# Patient Record
Sex: Male | Born: 1995 | Hispanic: Yes | Marital: Single | Smoking: Never smoker
Health system: Southern US, Community
[De-identification: ages and names within clinical notes are randomized; demographics above are authoritative.]

## PROBLEM LIST (undated history)

## (undated) DIAGNOSIS — R079 Chest pain, unspecified: Secondary | ICD-10-CM

## (undated) DIAGNOSIS — J309 Allergic rhinitis, unspecified: Secondary | ICD-10-CM

## (undated) DIAGNOSIS — I10 Essential (primary) hypertension: Secondary | ICD-10-CM

## (undated) DIAGNOSIS — I319 Disease of pericardium, unspecified: Secondary | ICD-10-CM

## (undated) DIAGNOSIS — M25531 Pain in right wrist: Secondary | ICD-10-CM

## (undated) DIAGNOSIS — S52321A Displaced transverse fracture of shaft of right radius, initial encounter for closed fracture: Secondary | ICD-10-CM

## (undated) DIAGNOSIS — J45990 Exercise induced bronchospasm: Secondary | ICD-10-CM

## (undated) HISTORY — DX: Essential (primary) hypertension: I10

## (undated) HISTORY — PX: FINGER SURGERY: SHX640

## (undated) HISTORY — DX: Pain in right wrist: M25.531

## (undated) HISTORY — DX: Allergic rhinitis, unspecified: J30.9

## (undated) HISTORY — DX: Exercise induced bronchospasm: J45.990

## (undated) HISTORY — DX: Disease of pericardium, unspecified: I31.9

## (undated) HISTORY — DX: Displaced transverse fracture of shaft of right radius, initial encounter for closed fracture: S52.321A

## (undated) HISTORY — DX: Chest pain, unspecified: R07.9

---
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
## 2017-02-15 ENCOUNTER — Emergency Department (HOSPITAL_COMMUNITY): Payer: BLUE CROSS/BLUE SHIELD

## 2017-02-15 ENCOUNTER — Ambulatory Visit: Payer: Self-pay | Admitting: Physician Assistant

## 2017-02-15 ENCOUNTER — Emergency Department (HOSPITAL_COMMUNITY)
Admission: EM | Admit: 2017-02-15 | Discharge: 2017-02-15 | Disposition: A | Discharge status: Home / Self Care | Payer: BLUE CROSS/BLUE SHIELD | Attending: Emergency Medicine | Admitting: Emergency Medicine

## 2017-02-15 ENCOUNTER — Encounter (HOSPITAL_COMMUNITY): Payer: Self-pay | Admitting: Emergency Medicine

## 2017-02-15 DIAGNOSIS — Z79899 Other long term (current) drug therapy: Secondary | ICD-10-CM | POA: Diagnosis not present

## 2017-02-15 DIAGNOSIS — I10 Essential (primary) hypertension: Secondary | ICD-10-CM | POA: Insufficient documentation

## 2017-02-15 DIAGNOSIS — I309 Acute pericarditis, unspecified: Secondary | ICD-10-CM | POA: Diagnosis not present

## 2017-02-15 DIAGNOSIS — R0789 Other chest pain: Secondary | ICD-10-CM | POA: Diagnosis present

## 2017-02-15 HISTORY — DX: Essential (primary) hypertension: I10

## 2017-02-15 LAB — I-STAT TROPONIN, ED: TROPONIN I, POC: 0 ng/mL (ref 0.00–0.08)

## 2017-02-15 LAB — CBC
HCT: 43.2 % (ref 39.0–52.0)
Hemoglobin: 15.3 g/dL (ref 13.0–17.0)
MCH: 30.5 pg (ref 26.0–34.0)
MCHC: 35.4 g/dL (ref 30.0–36.0)
MCV: 86.1 fL (ref 78.0–100.0)
PLATELETS: 236 10*3/uL (ref 150–400)
RBC: 5.02 MIL/uL (ref 4.22–5.81)
RDW: 12.9 % (ref 11.5–15.5)
WBC: 13.7 10*3/uL — AB (ref 4.0–10.5)

## 2017-02-15 LAB — BASIC METABOLIC PANEL
Anion gap: 11 (ref 5–15)
BUN: 13 mg/dL (ref 6–20)
CO2: 21 mmol/L — ABNORMAL LOW (ref 22–32)
CREATININE: 0.96 mg/dL (ref 0.61–1.24)
Calcium: 9.3 mg/dL (ref 8.9–10.3)
Chloride: 100 mmol/L — ABNORMAL LOW (ref 101–111)
GFR calc Af Amer: >60 mL/min (ref >60)
Glucose, Bld: 91 mg/dL (ref 65–99)
POTASSIUM: 4.1 mmol/L (ref 3.5–5.1)
SODIUM: 132 mmol/L — AB (ref 135–145)

## 2017-02-15 MED ORDER — KETOROLAC TROMETHAMINE 30 MG/ML IJ SOLN
30.0000 mg | Freq: Once | INTRAMUSCULAR | Status: AC
  Administered 2017-02-15: 30 mg via INTRAVENOUS
  Filled 2017-02-15: qty 1

## 2017-02-15 MED ORDER — IBUPROFEN 600 MG PO TABS: 600.0000 mg | ORAL_TABLET | Freq: Four times a day (QID) | ORAL | 0 refills | Status: DC | PRN

## 2017-02-15 NOTE — Discharge Instructions (Signed)
Medications: ibuprofen  Treatment: Take ibuprofen every 6 hours until told otherwise by cardiology.   Follow-up: Please see cardiologist this coming week for follow up of today's visit and further evaluation and treatment. Please return to the emergency department if you develop of any new or worsening symptoms.

## 2017-02-15 NOTE — ED Triage Notes (Signed)
Pt. Stated, I started having CP yesterday morning when taking a breathe.

## 2017-02-15 NOTE — ED Provider Notes (Signed)
[name] Provider Note   CSN: [id number] Arrival date & time: 02/15/17  1245     History   Chief Complaint Chief Complaint  Patient presents with  . Chest Pain    HPI Jeff LeydenLuis Horton is a 21 y.o. male with history of hypertension who presents with a 1 day history of pleuritic chest pain and shortness of breath.  Patient describes his pain as constant and is worse with a deep breath.  It is sharp and pressure combined.  He has had some episodes of shortness of breath while he is walking.  He reports he was sick with a cold about a week and a half ago.  He denies any recent long trips, surgeries, known cancer, new leg pain or swelling (except for right ankle injury from wrestling), smoking, or history of blood clots.  He denies any drug use including cocaine.  He occasionally drinks alcohol.  He is not taking any medications at home for his symptoms.  HPI  Past Medical History:  Diagnosis Date  . Hypertension     There are no active problems to display for this patient.   History reviewed. No pertinent surgical history.     Home Medications    Prior to Admission medications   Medication Sig Start Date End Date Taking? Authorizing Provider  lisinopril (PRINIVIL,ZESTRIL) 20 MG tablet Take 10 mg by mouth daily. 01/30/17  Yes [provider]  ibuprofen (ADVIL,MOTRIN) 600 MG tablet Take 1 tablet (600 mg total) by mouth every 6 (six) hours as needed. 02/15/17   Emi HolesLaw, Cathrine Krizan M, PA-C    Family History No family history on file.  Social History Social History  Substance Use Topics  . Smoking status: Never Smoker  . Smokeless tobacco: Never Used  . Alcohol use Yes     Allergies   Patient has no known allergies.   Review of Systems Review of Systems  Constitutional: Negative for chills and fever.  HENT: Negative for facial swelling and sore throat.   Respiratory: Positive for shortness of breath.   Cardiovascular:  Positive for chest pain and leg swelling (Only right ankle).  Gastrointestinal: Negative for abdominal pain, nausea and vomiting.  Genitourinary: Negative for dysuria.  Musculoskeletal: Negative for back pain.  Skin: Negative for rash and wound.  Neurological: Negative for headaches.  Psychiatric/Behavioral: The patient is not nervous/anxious.      Physical Exam Updated Vital Signs BP 137/68   Pulse (!) 50   Temp 98.4 F (36.9 C) (Oral)   Resp 18   Ht 5\' 10"  (1.778 m)   Wt 93 kg (205 lb)   SpO2 100%   BMI 29.41 kg/m   Physical Exam  Constitutional: He appears well-developed and well-nourished. No distress.  HENT:  Head: Normocephalic and atraumatic.  Mouth/Throat: Oropharynx is clear and moist. No oropharyngeal exudate.  Eyes: Pupils are equal, round, and reactive to light. Conjunctivae are normal. Right eye exhibits no discharge. Left eye exhibits no discharge. No scleral icterus.  Neck: Normal range of motion. Neck supple. No thyromegaly present.  Cardiovascular: Normal rate, regular rhythm and intact distal pulses.  Exam reveals friction rub. Exam reveals no gallop.   No murmur heard. Pulmonary/Chest: Effort normal and breath sounds normal. No stridor. No respiratory distress. He has no wheezes. He has no rales.  Abdominal: Soft. Bowel sounds are normal. He exhibits no distension. There is no tenderness. There is no rebound and no guarding.  Musculoskeletal: He exhibits no edema.  Lymphadenopathy:    He has no cervical adenopathy.  Neurological: He is alert. Coordination normal.  Skin: Skin is warm and dry. No rash noted. He is not diaphoretic. No pallor.  Psychiatric: He has a normal mood and affect.  Nursing note and vitals reviewed.    ED Treatments / Results  Labs (all labs ordered are listed, but only abnormal results are displayed) Labs Reviewed  BASIC METABOLIC PANEL - Abnormal; Notable for the following:       Result Value   Sodium 132 (*)    Chloride 100  (*)    CO2 21 (*)    All other components within normal limits  CBC - Abnormal; Notable for the following:    WBC 13.7 (*)    All other components within normal limits  I-STAT TROPONIN, ED    EKG  EKG Interpretation  Date/Time:  Saturday February 15 2017 12:51:37 EDT Ventricular Rate:  79 PR Interval:  156 QRS Duration: 102 QT Interval:  372 QTC Calculation: 426 R Axis:   98 Text Interpretation:  Normal sinus rhythm with sinus arrhythmia Rightward axis Acute pericarditis Abnormal ECG No previous ECGs available Confirmed by Frederick Peers [phone]) on 02/15/2017 3:10:07 PM       Radiology Dg Chest 2 View  Result Date: 02/15/2017 CLINICAL DATA:  Midsternal chest pain. EXAM: CHEST  2 VIEW COMPARISON:  None. FINDINGS: The heart size and mediastinal contours are within normal limits. Both lungs are clear. The visualized skeletal structures are unremarkable. IMPRESSION: No active cardiopulmonary disease. Electronically Signed   By: Gerome Sam III M.D   On: 02/15/2017 14:26    Procedures Procedures (including critical care time)  Medications Ordered in ED Medications  ketorolac (TORADOL) 30 MG/ML injection 30 mg (30 mg Intravenous Given 02/15/17 1329)     Initial Impression / Assessment and Plan / ED Course  I have reviewed the triage vital signs and the nursing notes.  Pertinent labs & imaging results that were available during my care of the patient were reviewed by me and considered in my medical decision making (see chart for details).  Clinical Course as of Feb 16 1608  Sat Feb 15, 2017  1538 On reevaluation after Toradol, patient reports his symptoms are almost completely better and that he only feels pain with a super deep breath.  [AL]    Clinical Course User Index [AL] Emi Holes, PA-C    Patient with suspected pericarditis.  Chest x-ray is negative.  EKG shows diffuse ST elevation concerning for pericarditis.  Suspected friction rub found on  auscultation.  CBC shows WBC 13.7, otherwise labs unremarkable.  Patient having almost complete resolution of symptoms after Toradol.  Patient is well-appearing with stable vitals.  Will have patient follow-up with cardiology for further evaluation and treatment.  Return precautions discussed.  Will discharge home with regular dosing of ibuprofen until told otherwise by cardiology.  Patient understands and agrees with plan.  Patient discharged in satisfactory condition. I discussed patient case with Dr. Clarene Duke who guided the patient's management and agrees with plan.   Final Clinical Impressions(s) / ED Diagnoses   Final diagnoses:  Acute pericarditis, unspecified type    New Prescriptions New Prescriptions   IBUPROFEN (ADVIL,MOTRIN) 600 MG TABLET    Take 1 tablet (600 mg total) by mouth every 6 (six) hours as needed.     Emi Holes, PA-C 02/15/17 1609    Little, Ambrose Finland, MD 02/16/17 [phone]

## 2017-02-17 ENCOUNTER — Encounter: Payer: Self-pay | Admitting: *Deleted

## 2017-02-21 NOTE — Progress Notes (Signed)
Cardiology Office Note:    Date:  02/24/2017   ID:  Jeff Horton, DOB [birth year], MRN [id number]  PCP:  Jeff Horton, Jeff W, MD  Cardiologist:  Jeff HerrlichBrian Savannah Morford, MD   Referring MD: Jeff Horton, Jeff W, MD  ASSESSMENT:    1. Acute idiopathic pericarditis    PLAN:    In order of problems listed above:  1. Clinically resolved, check sedimentation rate before return to full activities and stopping ibuprofen.  With competitive sports chest pain abnormal EKG he will undergo echocardiogram.  I suspect it will be normal.  He will continue his antihypertensive treatment.  Next appointment as needed if echocardiogram and sedimentation rate are normal   Medication Adjustments/Labs and Tests Ordered: Current medicines are reviewed at length with the patient today.  Concerns regarding medicines are outlined above.  Orders Placed This Encounter  Procedures  . Sedimentation rate  . EKG 12-Lead  . ECHOCARDIOGRAM COMPLETE   No orders of the defined types were placed in this encounter.    Chief Complaint  Patient presents with  . Follow-up    Visit to ED with pericarditis    History of Present Illness:    Jeff LeydenLuis Horton is a 21 y.o. male who is being seen today for the evaluation of pericarditis [facility]  Treated with NSAI at the request of Jeff Horton, Jeff W, MD. He had pleuritic chest pain, friction rub and EKG changes of pericarditis.  He relates that his symptoms resolved within 24 hours taking ibuprofen.  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  His abnormal EKG and chest pain he will undergo echocardiogram.  He has a history of hypertension taking ACE inhibitor relates a normal echocardiogram 2 years ago.  If his sedimentation rate is normal he can stop ibuprofen and return to activities without restriction.  He had a URI preceding his pleuritic chest pain.  He has had no palpitation syncope exertional chest pain or dyspnea.  He has no family history of  cardiomyopathy or sudden cardiac death.  He has had no tick bite symptoms.  Past Medical History:  Diagnosis Date  . Allergic rhinitis 01/15/2013  . 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 Chest pain   . Closed displaced transverse fracture of shaft of right radius 06/28/2014  . HTN (hypertension) 01/05/2014   Overview:  BP mildly elevated.  NO LVH or coarctation on echo Monitoring BP and cardiology follow up in 02/2014  . Hypertension   . Pericarditis   . Right wrist pain 06/28/2014    History reviewed. No pertinent surgical history.  Current Medications: Current Meds  Medication Sig  . ibuprofen (ADVIL,MOTRIN) 600 MG tablet Take 1 tablet (600 mg total) by mouth every 6 (six) hours as needed.  Marland Kitchen. lisinopril (PRINIVIL,ZESTRIL) 20 MG tablet Take 20 mg by mouth daily.      Allergies:   Patient has no known allergies.   Social History   Social History  . Marital status: Single    Spouse name: N/A  . Number of children: N/A  . Years of education: N/A   Social History Main Topics  . Smoking status: Never Smoker  . Smokeless tobacco: Never Used  . Alcohol use Yes  . Drug use: No  . Sexual activity: Not Asked   Other Topics Concern  . None   Social History Narrative  . None     Family History: The patient's father had [facility], strong family history of hypertension ROS:  Review of Systems  Constitution: Negative.  HENT: Negative.   Eyes: Negative.   Cardiovascular: Positive for chest pain (resolved). Negative for claudication, cyanosis, dyspnea on exertion, irregular heartbeat, leg swelling, near-syncope, orthopnea, palpitations, paroxysmal nocturnal dyspnea and syncope.  Respiratory: Positive for shortness of breath (resolved). Negative for cough, hemoptysis, sleep disturbances due to breathing, snoring, sputum production and wheezing.   Endocrine: Negative.   Hematologic/Lymphatic: Negative.   Skin: Negative.   Musculoskeletal: Negative.     Gastrointestinal: Negative.   Genitourinary: Negative.   Neurological: Negative.   Psychiatric/Behavioral: Negative.   Allergic/Immunologic: Negative.    Please see the history of present illness.     EKGs/Labs/Other Studies Reviewed:    The following studies were reviewed today:  CHEST  2 VIEW COMPARISON:  None. FINDINGS: The heart size and mediastinal contours are within normal limits. Both lungs are clear. The visualized skeletal structures are unremarkable. IMPRESSION: No active cardiopulmonary disease. EKG today, sinus rhythm normal ST-T changes have resolved EKG:  EKG 02/15/17 with diffuse ST elevation and PR depression, acute pericarditis pattern  Recent Labs: WBC 13,700 02/15/2017: BUN 13; Creatinine, Ser 0.96; Hemoglobin 15.3; Platelets 236; Potassium 4.1; Sodium 132  Recent Lipid Panel No results found for: CHOL, TRIG, HDL, CHOLHDL, VLDL, LDLCALC, LDLDIRECT  Physical Exam:    VS:  BP 118/72 (BP Location: Left Arm, Patient Position: Sitting, Cuff Size: Normal)   Pulse 64   Ht 5\' 10"  (1.778 m)   Wt 206 lb (93.4 kg)   SpO2 98%   BMI 29.56 kg/m     Wt Readings from Last 3 Encounters:  02/24/17 206 lb (93.4 kg) (95 %, Z= 1.62)*  02/15/17 205 lb (93 kg) (95 %, Z= 1.60)*   * Growth percentiles are based on CDC 2-20 Years data.     GEN:  Well nourished, well developed in no acute distress HEENT: Normal NECK: No JVD; No carotid bruits LYMPHATICS: No lymphadenopathy CARDIAC: No murmur or rub RRR, no murmurs, rubs, gallops RESPIRATORY:  Clear to auscultation without rales, wheezing or rhonchi  ABDOMEN: Soft, non-tender, non-distended MUSCULOSKELETAL:  No edema; No deformity  SKIN: Warm and dry NEUROLOGIC:  Alert and oriented x 3 PSYCHIATRIC:  Normal affect     Signed, Jeff Herrlich, MD  02/24/2017 12:51 PM    [facility]

## 2017-02-24 ENCOUNTER — Ambulatory Visit (INDEPENDENT_AMBULATORY_CARE_PROVIDER_SITE_OTHER): Payer: BLUE CROSS/BLUE SHIELD | Admitting: Cardiology

## 2017-02-24 ENCOUNTER — Encounter: Payer: Self-pay | Admitting: Cardiology

## 2017-02-24 VITALS — BP 118/72 | HR 64 | Ht 70.0 in | Wt 206.0 lb

## 2017-02-24 DIAGNOSIS — I300 Acute nonspecific idiopathic pericarditis: Secondary | ICD-10-CM | POA: Diagnosis not present

## 2017-02-24 NOTE — Patient Instructions (Signed)
Medication Instructions:  Your physician recommends that you continue on your current medications as directed. Please refer to the Current Medication list given to you today.  Labwork: Your physician recommends that you return for lab work in: today. Sed rate  Testing/Procedures: You had an EKG today.  Your physician has requested that you have an echocardiogram. Echocardiography is a painless test that uses sound waves to create images of your heart. It provides your doctor with information about the size and shape of your heart and how well your heart's chambers and valves are working. This procedure takes approximately one hour. There are no restrictions for this procedure.  Follow-Up: Your physician recommends that you schedule a follow-up appointment as needed if symptoms worsen or fail to improve.  Any Other Special Instructions Will Be Listed Below (If Applicable).     If you need a refill on your cardiac medications before your next appointment, please call your pharmacy.

## 2017-02-25 ENCOUNTER — Other Ambulatory Visit: Payer: Self-pay

## 2017-02-25 ENCOUNTER — Ambulatory Visit (HOSPITAL_COMMUNITY): Payer: BLUE CROSS/BLUE SHIELD | Attending: Cardiology

## 2017-02-25 DIAGNOSIS — I300 Acute nonspecific idiopathic pericarditis: Secondary | ICD-10-CM

## 2017-02-25 DIAGNOSIS — I420 Dilated cardiomyopathy: Secondary | ICD-10-CM | POA: Insufficient documentation

## 2017-02-25 LAB — SEDIMENTATION RATE: Sed Rate: 6 mm/hr (ref 0–15)

## 2017-02-26 ENCOUNTER — Other Ambulatory Visit: Payer: Self-pay | Admitting: Cardiology

## 2017-02-26 DIAGNOSIS — I517 Cardiomegaly: Secondary | ICD-10-CM

## 2017-03-19 ENCOUNTER — Other Ambulatory Visit (HOSPITAL_BASED_OUTPATIENT_CLINIC_OR_DEPARTMENT_OTHER): Payer: PRIVATE HEALTH INSURANCE

## 2017-04-16 DIAGNOSIS — I300 Acute nonspecific idiopathic pericarditis: Secondary | ICD-10-CM | POA: Insufficient documentation

## 2017-05-06 ENCOUNTER — Encounter: Payer: Self-pay | Admitting: Cardiology

## 2017-05-06 ENCOUNTER — Telehealth: Payer: Self-pay | Admitting: Cardiology

## 2017-05-06 NOTE — Telephone Encounter (Signed)
Tried calling the patient to give him MRI appointment date and time, but, his VM was not set up yet so could not leave a message.

## 2017-05-16 ENCOUNTER — Ambulatory Visit (HOSPITAL_COMMUNITY)
Admission: RE | Admit: 2017-05-16 | Discharge: 2017-05-16 | Disposition: A | Discharge status: Home / Self Care | Payer: BLUE CROSS/BLUE SHIELD | Source: Ambulatory Visit | Attending: Cardiology | Admitting: Cardiology

## 2017-05-16 DIAGNOSIS — R9439 Abnormal result of other cardiovascular function study: Secondary | ICD-10-CM | POA: Diagnosis not present

## 2017-05-16 DIAGNOSIS — I517 Cardiomegaly: Secondary | ICD-10-CM | POA: Diagnosis present

## 2017-05-16 LAB — CREATININE, SERUM: Creatinine, Ser: 1.2 mg/dL (ref 0.61–1.24)

## 2017-05-16 MED ORDER — GADOBENATE DIMEGLUMINE 529 MG/ML IV SOLN
30.0000 mL | Freq: Once | INTRAVENOUS | Status: AC | PRN
  Administered 2017-05-16: 30 mL via INTRAVENOUS

## 2017-07-28 ENCOUNTER — Encounter: Payer: Self-pay | Admitting: Physician Assistant

## 2017-08-19 NOTE — Progress Notes (Signed)
Cardiology Office Note:    Date:  08/20/2017   ID:  Jeff Horton, DOB March 27, 1996, MRN [id number]  PCP:  Herbert Deaner, MD  Cardiologist:  Shirlee More, MD    Referring MD: Herbert Deaner, MD    ASSESSMENT:    1. Chest pain in adult   2. Pericarditis, unspecified chronicity, unspecified type    PLAN:    In order of problems listed above:  1. He has typical findings of pericarditis with symptoms rub EKG changes.  He will be treated with nonsteroidal anti-inflammatory drug colchicine check inflammatory markers and recheck echocardiogram.  I will reassess back in my office in 1 week.   Next appointment: 1 week   Medication Adjustments/Labs and Tests Ordered: Current medicines are reviewed at length with the patient today.  Concerns regarding medicines are outlined above.  Orders Placed This Encounter  Procedures  . Sed Rate (ESR)  . C-reactive protein  . EKG 12-Lead  . ECHOCARDIOGRAM COMPLETE   Meds ordered this encounter  Medications  . ibuprofen (ADVIL,MOTRIN) 800 MG tablet    Sig: Take 1 tablet (800 mg total) by mouth 3 (three) times daily.    Dispense:  90 tablet    Refill:  0  . colchicine 0.6 MG tablet    Sig: Take 1 tablet (0.6 mg total) by mouth 2 (two) times daily.    Dispense:  60 tablet    Refill:  0    Chief Complaint  Patient presents with  . Follow-up  . Chest Pain    previous pericarditis    History of Present Illness:    Jeff Horton is a 22 y.o. male with a hx of pericarditis in October 2018 and an abnormal TTE last seen 02/24/17.  IMPRESSION: CMR with LGE 1.  Trivial pericardial effusion. 2.  Mildly dilated LV with EF 61%, normal wall motion. 3.  Mildly dilated RV with EF 48% (EF within normal limits for RV). 4. No myocardial LGE, so no evidence for prior MI, infiltrative disease, or myocarditis. Given young age of patient, possible athlete's heart.  Echo TTE 02/25/18 - Left ventricle: The cavity size was normal. Wall thickness  was   increased in a pattern of mild LVH. Systolic function was normal.   The estimated ejection fraction was in the range of 60% to 65%.   Wall motion was normal; there were no regional wall motion   abnormalities. Left ventricular diastolic function parameters   were normal. - Right ventricle: The cavity size was mildly dilated. Wall   thickness was normal. - Right atrium: The atrium was mildly dilated. Central venous   pressure (est): 8 mm Hg. - Pericardium, extracardiac: There was no pericardial effusion.  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  The last 1 to 2 weeks he has had intermittent left-sided pleuritic chest pain more supine and took ibuprofen on few occasions which gave him transient relief over-the-counter 200 mg.  No fever or chills but has had a cough with a minimal amount of sputum he is not short of breath he has had no edema.  He has no other symptoms to suggest connective tissue disease such as rash or change in skin.  There is no family history of familial Mediterranean fever.  His EKG is classic for pericarditis today he has a pericardial rub and he had previous pericarditis in October 2018.  We will check inflammatory markers today for  repeat echocardiogram start him on ibuprofen and colchicine and reassess in 1 week.  If he does not respond to treatment he will need a rheumatologic evaluation. Past Medical History:  Diagnosis Date  . Allergic rhinitis 01/15/2013  . 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 Chest pain   . Closed displaced transverse fracture of shaft of right radius 06/28/2014  . HTN (hypertension) 01/05/2014   Overview:  BP mildly elevated.  NO LVH or coarctation on echo Monitoring BP and cardiology follow up in 02/2014  . Hypertension   . Pericarditis   . Right wrist pain 06/28/2014    Past Surgical History:  Procedure Laterality Date  . FINGER SURGERY      Current  Medications: Current Meds  Medication Sig  . ibuprofen (ADVIL,MOTRIN) 800 MG tablet Take 1 tablet (800 mg total) by mouth 3 (three) times daily.  Marland Kitchen lisinopril (PRINIVIL,ZESTRIL) 20 MG tablet Take 20 mg by mouth daily.   . [DISCONTINUED] ibuprofen (ADVIL,MOTRIN) 600 MG tablet Take 1 tablet (600 mg total) by mouth every 6 (six) hours as needed.     Allergies:   Patient has no known allergies.   Social History   Socioeconomic History  . Marital status: Single    Spouse name: Not on file  . Number of children: Not on file  . Years of education: Not on file  . Highest education level: Not on file  Occupational History  . Not on file  Social Needs  . Financial resource strain: Not on file  . Food insecurity:    Worry: Not on file    Inability: Not on file  . Transportation needs:    Medical: Not on file    Non-medical: Not on file  Tobacco Use  . Smoking status: Never Smoker  . Smokeless tobacco: Never Used  Substance and Sexual Activity  . Alcohol use: Yes  . Drug use: No  . Sexual activity: Not on file  Lifestyle  . Physical activity:    Days per week: Not on file    Minutes per session: Not on file  . Stress: Not on file  Relationships  . Social connections:    Talks on phone: Not on file    Gets together: Not on file    Attends religious service: Not on file    Active member of club or organization: Not on file    Attends meetings of clubs or organizations: Not on file    Relationship status: Not on file  Other Topics Concern  . Not on file  Social History Narrative  . Not on file     Family History: The patient's family history includes Asthma in his mother; Coronary artery disease in his father; Diabetes in his maternal grandfather and maternal grandmother; Hypertension in his father, maternal grandfather, maternal grandmother, paternal grandfather, and paternal grandmother. ROS:   Please see the history of present illness.    All other systems reviewed and  are negative.  EKGs/Labs/Other Studies Reviewed:    The following studies were reviewed today:  EKG:  EKG ordered today.  The ekg ordered today demonstrates sinus rhythm typical pericarditis with diffuse ST elevation and subtle PR segment depression markedly different than his baseline EKG for my office visit  Recent Labs: 02/15/2017: BUN 13; Hemoglobin 15.3; Platelets 236; Potassium 4.1; Sodium 132 05/16/2017: Creatinine, Ser 1.20  Recent Lipid Panel No results found for: CHOL, TRIG, HDL, CHOLHDL, VLDL, LDLCALC, LDLDIRECT  Physical Exam:    VS:  BP 106/74 (BP  Location: Right Arm, Patient Position: Sitting, Cuff Size: Large)   Pulse 66   Ht 5' 10"  (1.778 m)   Wt 206 lb 12.8 oz (93.8 kg)   SpO2 99%   BMI 29.67 kg/m     Wt Readings from Last 3 Encounters:  08/20/17 206 lb 12.8 oz (93.8 kg) (95 %, Z= 1.60)*  02/24/17 206 lb (93.4 kg) (95 %, Z= 1.62)*  02/15/17 205 lb (93 kg) (95 %, Z= 1.60)*   * Growth percentiles are based on CDC (Boys, 2-20 Years) data.     GEN:  Well nourished, well developed in no acute distress HEENT: Normal NECK: No JVD; No carotid bruits LYMPHATICS: No lymphadenopathy CARDIAC: faint pleuropericardial rub on inspiration RRR, no murmurs, rubs, gallops RESPIRATORY:  Clear to auscultation without rales, wheezing or rhonchi  ABDOMEN: Soft, non-tender, non-distended MUSCULOSKELETAL:  No edema; No deformity  SKIN: Warm and dry NEUROLOGIC:  Alert and oriented x 3 PSYCHIATRIC:  Normal affect    Signed, Shirlee More, MD  08/20/2017 9:01 AM    [place]

## 2017-08-20 ENCOUNTER — Ambulatory Visit (INDEPENDENT_AMBULATORY_CARE_PROVIDER_SITE_OTHER): Payer: BLUE CROSS/BLUE SHIELD | Admitting: Cardiology

## 2017-08-20 ENCOUNTER — Encounter: Payer: Self-pay | Admitting: Cardiology

## 2017-08-20 ENCOUNTER — Telehealth: Payer: Self-pay | Admitting: *Deleted

## 2017-08-20 VITALS — BP 106/74 | HR 66 | Ht 70.0 in | Wt 206.8 lb

## 2017-08-20 DIAGNOSIS — R079 Chest pain, unspecified: Secondary | ICD-10-CM

## 2017-08-20 DIAGNOSIS — I319 Disease of pericardium, unspecified: Secondary | ICD-10-CM | POA: Diagnosis not present

## 2017-08-20 DIAGNOSIS — I300 Acute nonspecific idiopathic pericarditis: Secondary | ICD-10-CM | POA: Insufficient documentation

## 2017-08-20 MED ORDER — IBUPROFEN 800 MG PO TABS: 800.0000 mg | ORAL_TABLET | Freq: Three times a day (TID) | ORAL | 0 refills | Status: DC

## 2017-08-20 MED ORDER — COLCHICINE 0.6 MG PO TABS: 0.6000 mg | ORAL_TABLET | Freq: Two times a day (BID) | ORAL | 0 refills | Status: DC

## 2017-08-20 NOTE — Patient Instructions (Signed)
Medication Instructions:  Your physician has recommended you make the following change in your medication:  START colchicine 0.6 mg twice daily START ibuprofen 800 mg three times daily with food  Labwork: Your physician recommends that you have the following labs drawn: CRP, sedimentation rate  Testing/Procedures: Your physician has requested that you have an echocardiogram. Echocardiography is a painless test that uses sound waves to create images of your heart. It provides your doctor with information about the size and shape of your heart and how well your heart's chambers and valves are working. This procedure takes approximately one hour. There are no restrictions for this procedure.  Follow-Up: Your physician recommends that you schedule a follow-up appointment in: 1 week.  Any Other Special Instructions Will Be Listed Below (If Applicable).     If you need a refill on your cardiac medications before your next appointment, please call your pharmacy.

## 2017-08-20 NOTE — Telephone Encounter (Signed)
Pt phoned to verify how long should be on Colchicine. Looked in chart and let pt know he was given 30 days worth. Pt stated his understanding.

## 2017-08-21 ENCOUNTER — Ambulatory Visit (HOSPITAL_BASED_OUTPATIENT_CLINIC_OR_DEPARTMENT_OTHER)
Admission: RE | Admit: 2017-08-21 | Discharge: 2017-08-21 | Disposition: A | Discharge status: Home / Self Care | Payer: BLUE CROSS/BLUE SHIELD | Source: Ambulatory Visit | Attending: Cardiology | Admitting: Cardiology

## 2017-08-21 ENCOUNTER — Telehealth: Payer: Self-pay

## 2017-08-21 DIAGNOSIS — I10 Essential (primary) hypertension: Secondary | ICD-10-CM | POA: Insufficient documentation

## 2017-08-21 DIAGNOSIS — I878 Other specified disorders of veins: Secondary | ICD-10-CM | POA: Insufficient documentation

## 2017-08-21 DIAGNOSIS — I313 Pericardial effusion (noninflammatory): Secondary | ICD-10-CM | POA: Insufficient documentation

## 2017-08-21 DIAGNOSIS — R079 Chest pain, unspecified: Secondary | ICD-10-CM | POA: Diagnosis present

## 2017-08-21 DIAGNOSIS — I319 Disease of pericardium, unspecified: Secondary | ICD-10-CM

## 2017-08-21 LAB — C-REACTIVE PROTEIN: CRP: 18.6 mg/L — ABNORMAL HIGH (ref 0.0–4.9)

## 2017-08-21 LAB — SEDIMENTATION RATE: Sed Rate: 2 mm/hr (ref 0–15)

## 2017-08-21 NOTE — Telephone Encounter (Signed)
-----   Message from Baldo DaubBrian J Munley, MD sent at 08/21/2017 12:24 PM EDT ----- Echo with small effusion, continue meds

## 2017-08-21 NOTE — Telephone Encounter (Signed)
Attempted to contact patient, no answer, no voicemail. Will continue efforts. 

## 2017-08-25 NOTE — Telephone Encounter (Signed)
Attempted to contact patient, no answer, no voicemail. Mailing results to patient's home address.

## 2017-08-29 ENCOUNTER — Encounter: Payer: Self-pay | Admitting: Cardiology

## 2017-08-29 ENCOUNTER — Ambulatory Visit (INDEPENDENT_AMBULATORY_CARE_PROVIDER_SITE_OTHER): Payer: BLUE CROSS/BLUE SHIELD | Admitting: Cardiology

## 2017-08-29 VITALS — BP 140/70 | HR 65 | Ht 70.0 in | Wt 209.0 lb

## 2017-08-29 DIAGNOSIS — I300 Acute nonspecific idiopathic pericarditis: Secondary | ICD-10-CM | POA: Diagnosis not present

## 2017-08-29 MED ORDER — COLCHICINE 0.6 MG PO TABS
0.6000 mg | ORAL_TABLET | Freq: Two times a day (BID) | ORAL | 0 refills | Status: DC
Start: 2017-08-29 — End: 2017-11-12

## 2017-08-29 NOTE — Patient Instructions (Signed)
Medication Instructions:  Your physician has recommended you make the following change in your medication: REDUCE colchicine to once daily  Labwork: Your physician recommends that you have the following labs drawn: CBC, CRP  Testing/Procedures: You had an EKG today.  Follow-Up: Your physician recommends that you schedule a follow-up appointment in: 3 weeks.  Any Other Special Instructions Will Be Listed Below (If Applicable).     If you need a refill on your cardiac medications before your next appointment, please call your pharmacy.

## 2017-08-29 NOTE — Progress Notes (Signed)
Cardiology Office Note:    Date:  08/29/2017   ID:  Dhillon Comunale, DOB 1995/07/30, MRN [id number]  PCP:  Maura Crandall, MD  Cardiologist:  Norman Herrlich, MD    Referring MD: Maura Crandall, MD    ASSESSMENT:    1. Recurrent idiopathic pericarditis    PLAN:    In order of problems listed above:  1. He is improved his EKG is normalized chest pain quickly resolved and will continue his nonsteroidal anti-inflammatory drug for 1 month and until his CRP is normal.  He will reduce colchicine to once daily after 30 days and we will plan to continue for 3 to 6 months with his recurrent pericarditis.  I asked him to continue to abstain or athletic training until his next visit   Next appointment: 3 weeks   Medication Adjustments/Labs and Tests Ordered: Current medicines are reviewed at length with the patient today.  Concerns regarding medicines are outlined above.  No orders of the defined types were placed in this encounter.  No orders of the defined types were placed in this encounter.   Chief Complaint  Patient presents with  . Follow-up    History of Present Illness:    Jeff Horton is a 22 y.o. male with a hx of recurrent pericarditis  last seen 08/20/17.  Echo 08/21/17: Study Conclusions  - Left ventricle: The cavity size was normal. Wall thickness was   normal. Systolic function was normal. The estimated ejection   fraction was in the range of 60% to 65%. Wall motion was normal;   there were no regional wall motion abnormalities. Left   ventricular diastolic function parameters were normal. - Inferior vena cava: The vessel was mildly dilated. The   respirophasic diameter changes were in the normal range (>= 50%). - Pericardium, extracardiac: A small, free-flowing pericardial   effusion was identified circumferential to the heart. The fluid   had no internal echoes.  ASSESSMENT:    1. Chest pain in adult   2. Pericarditis, unspecified chronicity,  unspecified type    PLAN:    In order of problems listed above:  1.     He has typical findings of pericarditis with symptoms rub EKG changes.  He will be treated with nonsteroidal anti-inflammatory drug colchicine check inflammatory markers and recheck echocardiogram.  I will reassess back in my office in 1 week.  Compliance with diet, lifestyle and medications: Yes He is improved does not feel ill no pleuritic chest pain fever or chills.  He has had palpitation extra beat at night not persistent severe or frequent.  With extra beats he has momentary breathlessness.  His EKG today confirms sinus rhythm Past Medical History:  Diagnosis Date  . Allergic rhinitis 01/15/2013  . 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 Chest pain   . Closed displaced transverse fracture of shaft of right radius 06/28/2014  . HTN (hypertension) 01/05/2014   Overview:  BP mildly elevated.  NO LVH or coarctation on echo Monitoring BP and cardiology follow up in 02/2014  . Hypertension   . Pericarditis   . Right wrist pain 06/28/2014    Past Surgical History:  Procedure Laterality Date  . FINGER SURGERY      Current Medications: Current Meds  Medication Sig  . colchicine 0.6 MG tablet Take 1 tablet (0.6 mg total) by mouth 2 (two) times daily.  Marland Kitchen ibuprofen (ADVIL,MOTRIN) 800 MG tablet Take 1 tablet (800 mg total) by mouth 3 (three) times daily.  Marland Kitchen  lisinopril (PRINIVIL,ZESTRIL) 20 MG tablet Take 20 mg by mouth daily.      Allergies:   Patient has no known allergies.   Social History   Socioeconomic History  . Marital status: Single    Spouse name: Not on file  . Number of children: Not on file  . Years of education: Not on file  . Highest education level: Not on file  Occupational History  . Not on file  Social Needs  . Financial resource strain: Not on file  . Food insecurity:    Worry: Not on file    Inability: Not on file  . Transportation needs:    Medical: Not on file    Non-medical: Not on  file  Tobacco Use  . Smoking status: Never Smoker  . Smokeless tobacco: Never Used  Substance and Sexual Activity  . Alcohol use: Yes  . Drug use: No  . Sexual activity: Not on file  Lifestyle  . Physical activity:    Days per week: Not on file    Minutes per session: Not on file  . Stress: Not on file  Relationships  . Social connections:    Talks on phone: Not on file    Gets together: Not on file    Attends religious service: Not on file    Active member of club or organization: Not on file    Attends meetings of clubs or organizations: Not on file    Relationship status: Not on file  Other Topics Concern  . Not on file  Social History Narrative  . Not on file     Family History: The patient's family history includes Asthma in his mother; Coronary artery disease in his father; Diabetes in his maternal grandfather and maternal grandmother; Hypertension in his father, maternal grandfather, maternal grandmother, paternal grandfather, and paternal grandmother. ROS:   Please see the history of present illness.    All other systems reviewed and are negative.  EKGs/Labs/Other Studies Reviewed:    The following studies were reviewed today:  EKG:  EKG ordered today.  The ekg ordered today demonstrates sinus rhythm and ST segments are normal  Recent Labs:  Sed Rate 0 - 15 mm/hr 2  6    CRP 0.0 - 4.9 mg/L 18.6High      02/15/2017: BUN 13; Hemoglobin 15.3; Platelets 236; Potassium 4.1; Sodium 132 05/16/2017: Creatinine, Ser 1.20  Recent Lipid Panel No results found for: CHOL, TRIG, HDL, CHOLHDL, VLDL, LDLCALC, LDLDIRECT  Physical Exam:    VS:  Wt 209 lb (94.8 kg)   BMI 29.99 kg/m     Wt Readings from Last 3 Encounters:  08/29/17 209 lb (94.8 kg) (95 %, Z= 1.65)*  08/20/17 206 lb 12.8 oz (93.8 kg) (95 %, Z= 1.60)*  02/24/17 206 lb (93.4 kg) (95 %, Z= 1.62)*   * Growth percentiles are based on CDC (Boys, 2-20 Years) data.     GEN:  Well nourished, well developed in  no acute distress HEENT: Normal NECK: No JVD; No carotid bruits LYMPHATICS: No lymphadenopathy CARDIAC: no rub is present today RRR, no murmurs, rubs, gallops RESPIRATORY:  Clear to auscultation without rales, wheezing or rhonchi  ABDOMEN: Soft, non-tender, non-distended MUSCULOSKELETAL:  No edema; No deformity  SKIN: Warm and dry NEUROLOGIC:  Alert and oriented x 3 PSYCHIATRIC:  Normal affect    Signed, Norman Herrlich, MD  08/29/2017 11:26 AM    [facility]

## 2017-08-30 LAB — CBC
Hematocrit: 41.6 % (ref 37.5–51.0)
Hemoglobin: 13.8 g/dL (ref 13.0–17.7)
MCH: 30.1 pg (ref 26.6–33.0)
MCHC: 33.2 g/dL (ref 31.5–35.7)
MCV: 91 fL (ref 79–97)
Platelets: 257 10*3/uL (ref 150–379)
RBC: 4.59 x10E6/uL (ref 4.14–5.80)
RDW: 14.4 % (ref 12.3–15.4)
WBC: 9.8 10*3/uL (ref 3.4–10.8)

## 2017-08-30 LAB — C-REACTIVE PROTEIN: CRP: 48 mg/L — ABNORMAL HIGH (ref 0.0–4.9)

## 2017-09-23 ENCOUNTER — Telehealth: Payer: Self-pay

## 2017-09-23 MED ORDER — IBUPROFEN 800 MG PO TABS
800.0000 mg | ORAL_TABLET | Freq: Three times a day (TID) | ORAL | 1 refills | Status: DC
Start: 2017-09-23 — End: 2017-11-12

## 2017-09-23 NOTE — Telephone Encounter (Signed)
Rx for Ibuprofen  one tablet three times a day #270 sent to pharmacy as requested.

## 2017-09-24 NOTE — Progress Notes (Signed)
Cardiology Office Note:    Date:  09/26/2017   ID:  Jeff Horton, DOB May 04, 1995, MRN [id number]  PCP:  Maura Crandall, MD  Cardiologist:  Norman Herrlich, MD    Referring MD: Maura Crandall, MD    ASSESSMENT:    1. Recurrent idiopathic pericarditis   2. Chest pain in adult    PLAN:    In order of problems listed above:  1. He has ongoing symptoms despite colchicine and NSAID.  We will recheck a CRP and if it remains elevated initiate steroids.  I have asked asked for labs to be done to screen for lupus he has no other criteria and will continue colchicine and NSAID.  At this time I do not think he requires a Holter or event monitor.   Next appointment: 6 weeks   Medication Adjustments/Labs and Tests Ordered: Current medicines are reviewed at length with the patient today.  Concerns regarding medicines are outlined above.  Orders Placed This Encounter  Procedures  . Antinuclear Antib (ANA)  . Anti-DNA antibody, double-stranded  . C3 complement  . C4 complement  . CBC with Differential  . Comprehensive Metabolic Panel (CMET)  . C-reactive protein  . EKG 12-Lead   No orders of the defined types were placed in this encounter.   Chief Complaint  Patient presents with  . Follow-up    for pericarditis    History of Present Illness:    Jeff Horton is a 22 y.o. male with a hx of recurrent pericarditis last seen 08/29/17. Compliance with diet, lifestyle and medications: Yes He tolerates and continues on prescription strength NSAID and colchicine.  He is continuing to have intermittent not severe pleuritic left-sided chest pain.  Also some mild palpitation at nighttime not severe sustained and he does not feel systemically ill no fever or chills and is having no joint skin problems and no other findings to suggest connective tissue disease. Past Medical History:  Diagnosis Date  . Allergic rhinitis 01/15/2013  . 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 Chest pain   .  Closed displaced transverse fracture of shaft of right radius 06/28/2014  . HTN (hypertension) 01/05/2014   Overview:  BP mildly elevated.  NO LVH or coarctation on echo Monitoring BP and cardiology follow up in 02/2014  . Hypertension   . Pericarditis   . Right wrist pain 06/28/2014    Past Surgical History:  Procedure Laterality Date  . FINGER SURGERY      Current Medications: Current Meds  Medication Sig  . colchicine 0.6 MG tablet Take 1 tablet (0.6 mg total) by mouth 2 (two) times daily. Reduce to once a day on 09/19/17  . ibuprofen (ADVIL,MOTRIN) 800 MG tablet Take 1 tablet (800 mg total) by mouth 3 (three) times daily.  Marland Kitchen lisinopril (PRINIVIL,ZESTRIL) 20 MG tablet Take 20 mg by mouth daily.      Allergies:   Patient has no known allergies.   Social History   Socioeconomic History  . Marital status: Single    Spouse name: Not on file  . Number of children: Not on file  . Years of education: Not on file  . Highest education level: Not on file  Occupational History  . Not on file  Social Needs  . Financial resource strain: Not on file  . Food insecurity:    Worry: Not on file    Inability: Not on file  . Transportation needs:    Medical: Not on file    Non-medical:  Not on file  Tobacco Use  . Smoking status: Never Smoker  . Smokeless tobacco: Never Used  Substance and Sexual Activity  . Alcohol use: Yes  . Drug use: No  . Sexual activity: Not on file  Lifestyle  . Physical activity:    Days per week: Not on file    Minutes per session: Not on file  . Stress: Not on file  Relationships  . Social connections:    Talks on phone: Not on file    Gets together: Not on file    Attends religious service: Not on file    Active member of club or organization: Not on file    Attends meetings of clubs or organizations: Not on file    Relationship status: Not on file  Other Topics Concern  . Not on file  Social History Narrative  . Not on file     Family  History: The patient's  family history includes Asthma in his mother; Coronary artery disease in his father; Diabetes in his maternal grandfather and maternal grandmother; Hypertension in his father, maternal grandfather, maternal grandmother, paternal grandfather, and paternal grandmother. ROS:   Please see the history of present illness.    All other systems reviewed and are negative.  EKGs/Labs/Other Studies Reviewed:    The following studies were reviewed today:  EKG:  EKG ordered today.  The ekg ordered today demonstrates sinus rhythm normal no indication of acute pericarditis single PVC present  Recent Labs:  CRP 0.0 - 4.9 mg/L 48.0High   18.6High      02/15/2017: BUN 13; Potassium 4.1; Sodium 132 05/16/2017: Creatinine, Ser 1.20 08/29/2017: Hemoglobin 13.8; Platelets 257  Recent Lipid Panel No results found for: CHOL, TRIG, HDL, CHOLHDL, VLDL, LDLCALC, LDLDIRECT  Physical Exam:    VS:  BP 130/90 (BP Location: Right Arm, Patient Position: Sitting, Cuff Size: Normal)   Pulse (!) 59   Ht  (1.778 m)   Wt 201 lb (91.2 kg)   SpO2 99%   BMI 28.84 kg/m     Wt Readings from Last 3 Encounters:  09/26/17 201 lb (91.2 kg) (93 %, Z= 1.46)*  08/29/17 209 lb (94.8 kg) (95 %, Z= 1.65)*  08/20/17 206 lb 12.8 oz (93.8 kg) (95 %, Z= 1.60)*   * Growth percentiles are based on CDC (Boys, 2-20 Years) data.     GEN:  Well nourished, well developed in no acute distress HEENT: Normal NECK: No JVD; No carotid bruits LYMPHATICS: No lymphadenopathy CARDIAC: No rubRRR, no murmurs, rubs, gallops RESPIRATORY:  Clear to auscultation without rales, wheezing or rhonchi  ABDOMEN: Soft, non-tender, non-distended MUSCULOSKELETAL:  No edema; No deformity  SKIN: Warm and dry NEUROLOGIC:  Alert and oriented x 3 PSYCHIATRIC:  Normal affect    Signed, Norman Herrlich, MD  09/26/2017 12:33 PM    [facility]

## 2017-09-26 ENCOUNTER — Ambulatory Visit (INDEPENDENT_AMBULATORY_CARE_PROVIDER_SITE_OTHER): Payer: BLUE CROSS/BLUE SHIELD | Admitting: Cardiology

## 2017-09-26 ENCOUNTER — Encounter: Payer: Self-pay | Admitting: Cardiology

## 2017-09-26 VITALS — BP 130/90 | HR 59 | Ht 70.0 in | Wt 201.0 lb

## 2017-09-26 DIAGNOSIS — I300 Acute nonspecific idiopathic pericarditis: Secondary | ICD-10-CM

## 2017-09-26 DIAGNOSIS — R079 Chest pain, unspecified: Secondary | ICD-10-CM | POA: Diagnosis not present

## 2017-09-26 NOTE — Patient Instructions (Signed)
Medication Instructions:  Your physician recommends that you continue on your current medications as directed. Please refer to the Current Medication list given to you today.   Labwork: Your physician recommends that you have lab work today        Testing/Procedures: You had an EKG today  Follow-Up: Your physician recommends that you schedule a follow-up appointment in: 6 weeks.   Any Other Special Instructions Will Be Listed Below (If Applicable).     If you need a refill on your cardiac medications before your next appointment, please call your pharmacy.

## 2017-09-27 LAB — CBC WITH DIFFERENTIAL/PLATELET
BASOS ABS: 0 10*3/uL (ref 0.0–0.2)
Basos: 0 %
EOS (ABSOLUTE): 0.2 10*3/uL (ref 0.0–0.4)
Eos: 3 %
HEMATOCRIT: 41.6 % (ref 37.5–51.0)
Hemoglobin: 14.3 g/dL (ref 13.0–17.7)
Immature Grans (Abs): 0 10*3/uL (ref 0.0–0.1)
Immature Granulocytes: 0 %
LYMPHS ABS: 2.2 10*3/uL (ref 0.7–3.1)
Lymphs: 29 %
MCH: 29.6 pg (ref 26.6–33.0)
MCHC: 34.4 g/dL (ref 31.5–35.7)
MCV: 86 fL (ref 79–97)
MONOS ABS: 0.6 10*3/uL (ref 0.1–0.9)
Monocytes: 8 %
Neutrophils Absolute: 4.6 10*3/uL (ref 1.4–7.0)
Neutrophils: 60 %
Platelets: 206 10*3/uL (ref 150–450)
RBC: 4.83 x10E6/uL (ref 4.14–5.80)
RDW: 14.2 % (ref 12.3–15.4)
WBC: 7.6 10*3/uL (ref 3.4–10.8)

## 2017-09-27 LAB — C4 COMPLEMENT: COMPLEMENT C4, SERUM: 24 mg/dL (ref 14–44)

## 2017-09-27 LAB — C-REACTIVE PROTEIN: CRP: 2.8 mg/L (ref 0.0–4.9)

## 2017-09-27 LAB — C3 COMPLEMENT: COMPLEMENT C3, SERUM: 106 mg/dL (ref 82–167)

## 2017-09-27 LAB — COMPREHENSIVE METABOLIC PANEL
ALK PHOS: 63 IU/L (ref 39–117)
ALT: 26 IU/L (ref 0–44)
AST: 21 IU/L (ref 0–40)
Albumin/Globulin Ratio: 2.2 (ref 1.2–2.2)
Albumin: 4.7 g/dL (ref 3.5–5.5)
BILIRUBIN TOTAL: 0.3 mg/dL (ref 0.0–1.2)
BUN/Creatinine Ratio: 22 — ABNORMAL HIGH (ref 9–20)
BUN: 20 mg/dL (ref 6–20)
CHLORIDE: 101 mmol/L (ref 96–106)
CO2: 24 mmol/L (ref 20–29)
CREATININE: 0.92 mg/dL (ref 0.76–1.27)
Calcium: 9.5 mg/dL (ref 8.7–10.2)
GFR calc Af Amer: 139 mL/min/{1.73_m2} (ref >59)
GFR calc non Af Amer: 120 mL/min/{1.73_m2} (ref >59)
GLOBULIN, TOTAL: 2.1 g/dL (ref 1.5–4.5)
GLUCOSE: 86 mg/dL (ref 65–99)
Potassium: 4.8 mmol/L (ref 3.5–5.2)
SODIUM: 141 mmol/L (ref 134–144)
Total Protein: 6.8 g/dL (ref 6.0–8.5)

## 2017-09-27 LAB — ANTI-DNA ANTIBODY, DOUBLE-STRANDED: dsDNA Ab: 1 IU/mL (ref 0–9)

## 2017-09-27 LAB — ANA: Anti Nuclear Antibody(ANA): NEGATIVE

## 2017-09-29 ENCOUNTER — Telehealth: Payer: Self-pay

## 2017-09-29 DIAGNOSIS — I300 Acute nonspecific idiopathic pericarditis: Secondary | ICD-10-CM

## 2017-09-29 DIAGNOSIS — R079 Chest pain, unspecified: Secondary | ICD-10-CM

## 2017-09-29 NOTE — Telephone Encounter (Signed)
Patient notified of normal lab results per Dr Dulce SellarMunley, and that steroids will not be started at this time.   Patient advised to recheck CRP in 2 weeks. Patient states that [facility] will [facility] his home to have labs rechecked.  Order for CRP entered.  Patient verbalized understanding.

## 2017-09-29 NOTE — Telephone Encounter (Signed)
-----   Message from Baldo DaubBrian J Munley, MD sent at 09/29/2017  7:39 AM EDT ----- CRP and labs for rheumatologist disorders are all normal. I am not going to start steroids. Can he repeat a CRP in 2 weeks?

## 2017-11-11 NOTE — Progress Notes (Signed)
Cardiology Office Note:    Date:  11/12/2017   ID:  Jeff Horton, DOB March 16, 1996, MRN [id number]  PCP:  Maura Crandall, MD  Cardiologist:  Norman Herrlich, MD    Referring MD: Maura Crandall, MD    ASSESSMENT:    1. Recurrent idiopathic pericarditis   2. Essential hypertension   3. PVCs (premature ventricular contractions)   4. Palpitation   5. PVC (premature ventricular contraction)    PLAN:    In order of problems listed above:  1. Stable he is off treatment we will check an hs-CRP of elevated resume otherwise we will hold on drug therapy 2. Stable continue ACE inhibitor 3. Symptomatic await Holter place him on a low-dose of the selective beta-blocker to alleviate symptoms   Next appointment: 3 months   Medication Adjustments/Labs and Tests Ordered: Current medicines are reviewed at length with the patient today.  Concerns regarding medicines are outlined above.  Orders Placed This Encounter  Procedures  . CRP High sensitivity   Meds ordered this encounter  Medications  . acebutolol (SECTRAL) 400 MG capsule    Sig: Take 1 capsule (400 mg total) by mouth 2 (two) times daily.    Dispense:  30 capsule    Refill:  3    Chief Complaint  Patient presents with  . Palpitations    History of Present Illness:    Jeff Horton is a 22 y.o. male with a hx of recurrent idiopathic pericarditis last seen 10/06/2017.  His laboratory studies show no indication of a rheumatologic disease like lupus.  He has been maintained on colchicine and nonsteroidal anti-inflammatory drugs since his relapse. He was seen by Dr Farrel Conners at [facility] for a second opinion.  & Units 54mo ago (09/26/17) 74mo ago (08/29/17) 74mo ago (08/20/17)   CRP 0.0 - 4.9 mg/L 2.8  48.0High   18.6High     He has been off a NSAID and colchicine for the last month. He wore a Holter last weel results are pending. Compliance with diet, lifestyle and medications: Yes  He had been off of his nonsteroidal colchicine for a  few weeks before he is seen in rotation the physician was aware he has had no recurrent pleurisy.  We discussed restarting treatment and will order to do his check a high-sensitivity CRP of his elevated go back and treated if not remain off.  Couple times a day as brief momentary palpitation was told he had PVCs by the other cardiologist or monitor and relates that the results will come to me.  We had a discussion about treatment with the beta-blocker this make some apprehensive very cardiac aware so put him on a low-dose of a selective beta-blocker with his history of asthma to alleviate symptoms.  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  I told him if he does not hear from me in the next week about his Holter monitor contact my office. Past Medical History:  Diagnosis Date  . Allergic rhinitis 01/15/2013  . 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 Chest pain   . Closed displaced transverse fracture of shaft of right radius 06/28/2014  . HTN (hypertension) 01/05/2014   Overview:  BP mildly elevated.  NO LVH or coarctation on echo Monitoring BP and cardiology follow up in 02/2014  . Hypertension   . Pericarditis   . Right wrist pain 06/28/2014  Past Surgical History:  Procedure Laterality Date  . FINGER SURGERY      Current Medications: Current Meds  Medication Sig  . lisinopril (PRINIVIL,ZESTRIL) 20 MG tablet Take 20 mg by mouth daily.      Allergies:   Patient has no known allergies.   Social History   Socioeconomic History  . Marital status: Single    Spouse name: Not on file  . Number of children: Not on file  . Years of education: Not on file  . Highest education level: Not on file  Occupational History  . Not on file  Social Needs  . Financial resource strain: Not on file  . Food insecurity:    Worry: Not on file    Inability: Not on file  . Transportation needs:     Medical: Not on file    Non-medical: Not on file  Tobacco Use  . Smoking status: Never Smoker  . Smokeless tobacco: Never Used  Substance and Sexual Activity  . Alcohol use: Yes  . Drug use: No  . Sexual activity: Not on file  Lifestyle  . Physical activity:    Days per week: Not on file    Minutes per session: Not on file  . Stress: Not on file  Relationships  . Social connections:    Talks on phone: Not on file    Gets together: Not on file    Attends religious service: Not on file    Active member of club or organization: Not on file    Attends meetings of clubs or organizations: Not on file    Relationship status: Not on file  Other Topics Concern  . Not on file  Social History Narrative  . Not on file     Family History: The patient's family history includes Asthma in his mother; Coronary artery disease in his father; Diabetes in his maternal grandfather and maternal grandmother; Hypertension in his father, maternal grandfather, maternal grandmother, paternal grandfather, and paternal grandmother. ROS:   Please see the history of present illness.    All other systems reviewed and are negative.  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  Recent Labs:   CBC 09/30/2017 normal viral serologies were normal  09/26/2017: ALT 26; BUN 20; Creatinine, Ser 0.92; Hemoglobin 14.3; Platelets 206; Potassium 4.8; Sodium 141  Recent Lipid Panel No results found for: CHOL, TRIG, HDL, CHOLHDL, VLDL, LDLCALC, LDLDIRECT  Physical Exam:    VS:  BP 124/70 (BP Location: Right Arm, Patient Position: Sitting, Cuff Size: Normal)   Pulse 69   Ht 5\' 10"  (1.778 m)   Wt 202 lb 6.4 oz (91.8 kg)   SpO2 99%   BMI 29.04 kg/m     Wt Readings from Last 3 Encounters:  11/12/17 202 lb 6.4 oz (91.8 kg) (93  %, Z= 1.48)*  09/26/17 201 lb (91.2 kg) (93 %, Z= 1.46)*  08/29/17 209 lb (94.8 kg) (95 %, Z= 1.65)*   * Growth percentiles are based on CDC (Boys, 2-20 Years) data.     GEN:  Well nourished, well developed in no acute distress HEENT: Normal NECK: No JVD; No carotid bruits LYMPHATICS: No lymphadenopathy CARDIAC: No pericardial rub RRR, no murmurs, rubs, gallops RESPIRATORY:  Clear to auscultation without rales, wheezing or rhonchi  ABDOMEN: Soft, non-tender, non-distended MUSCULOSKELETAL:  No edema;  No deformity  SKIN: Warm and dry NEUROLOGIC:  Alert and oriented x 3 PSYCHIATRIC:  Normal affect    Signed, Norman HerrlichBrian Munley, MD  11/12/2017 11:57 AM    [facility]

## 2017-11-12 ENCOUNTER — Ambulatory Visit: Payer: BLUE CROSS/BLUE SHIELD | Admitting: Cardiology

## 2017-11-12 ENCOUNTER — Encounter: Payer: Self-pay | Admitting: Cardiology

## 2017-11-12 VITALS — BP 124/70 | HR 69 | Ht 70.0 in | Wt 202.4 lb

## 2017-11-12 DIAGNOSIS — I300 Acute nonspecific idiopathic pericarditis: Secondary | ICD-10-CM | POA: Diagnosis not present

## 2017-11-12 DIAGNOSIS — R002 Palpitations: Secondary | ICD-10-CM | POA: Diagnosis not present

## 2017-11-12 DIAGNOSIS — I10 Essential (primary) hypertension: Secondary | ICD-10-CM | POA: Diagnosis not present

## 2017-11-12 DIAGNOSIS — I493 Ventricular premature depolarization: Secondary | ICD-10-CM | POA: Diagnosis not present

## 2017-11-12 LAB — HIGH SENSITIVITY CRP: CRP, High Sensitivity: 0.72 mg/L (ref 0.00–3.00)

## 2017-11-12 MED ORDER — ACEBUTOLOL HCL 400 MG PO CAPS: 400.0000 mg | ORAL_CAPSULE | Freq: Two times a day (BID) | ORAL | 3 refills | Status: DC

## 2017-11-12 NOTE — Patient Instructions (Signed)
Medication Instructions:  Your physician has recommended you make the following change in your medication:   START: acebutalol 400mg  one capsule daily  Labwork: Your physician recommends that you have labs drawn today.  Testing/Procedures: NONE  Follow-Up: Your physician wants you to follow-up in: 3 months.  You will receive a reminder letter in the mail two months in advance. If you don't receive a letter, please call our office to schedule the follow-up appointment.   Any Other Special Instructions Will Be Listed Below (If Applicable).     If you need a refill on your cardiac medications before your next appointment, please call your pharmacy.

## 2017-11-12 NOTE — Addendum Note (Signed)
Addended by: Lamona CurlOUTH, NICOLE H on: 11/12/2017 01:36 PM   Modules accepted: Orders

## 2017-11-14 ENCOUNTER — Telehealth: Payer: Self-pay

## 2017-11-14 DIAGNOSIS — I300 Acute nonspecific idiopathic pericarditis: Secondary | ICD-10-CM

## 2017-11-14 DIAGNOSIS — I10 Essential (primary) hypertension: Secondary | ICD-10-CM

## 2017-11-14 DIAGNOSIS — I319 Disease of pericardium, unspecified: Secondary | ICD-10-CM

## 2017-11-14 NOTE — Telephone Encounter (Signed)
Patient advised that Dr Dulce SellarMunley has reviewed Holter Monitor results from Cardiology in [place].  Patient aware that  he is having frequent PVC's and should stay on acebutolol.  Patient agrees to plan and verbalized understanding.

## 2017-11-14 NOTE — Telephone Encounter (Signed)
-----   Message from Baldo DaubBrian J Munley, MD sent at 11/14/2017  2:50 PM EDT ----- He has frequent PVC's, stay on acebutolol

## 2017-11-14 NOTE — Telephone Encounter (Signed)
-----   Message from Baldo DaubBrian J Munley, MD sent at 11/12/2017  8:29 PM EDT ----- Normal or stable result  Good result, [facility]

## 2017-11-14 NOTE — Telephone Encounter (Signed)
Patient advised of lab results per Dr Dulce SellarMunley, and to recheck labs in 1 month at [facility].   Patient aware and agrees to plan.

## 2017-11-17 ENCOUNTER — Ambulatory Visit: Payer: BLUE CROSS/BLUE SHIELD | Admitting: Cardiology

## 2017-12-17 ENCOUNTER — Other Ambulatory Visit: Payer: Self-pay | Admitting: Cardiology

## 2017-12-18 LAB — HIGH SENSITIVITY CRP: CRP, High Sensitivity: 3.39 mg/L — ABNORMAL HIGH (ref 0.00–3.00)

## 2017-12-19 ENCOUNTER — Telehealth: Payer: Self-pay

## 2017-12-19 DIAGNOSIS — R079 Chest pain, unspecified: Secondary | ICD-10-CM

## 2017-12-19 DIAGNOSIS — I300 Acute nonspecific idiopathic pericarditis: Secondary | ICD-10-CM

## 2017-12-19 NOTE — Telephone Encounter (Signed)
-----   Message from Baldo DaubBrian J Munley, MD sent at 12/18/2017 10:12 AM EDT ----- His CRP remains elevated  I would resume his NSAID and Colchicine as I feel that his risk of recurrence is significant  Will he take them?

## 2017-12-19 NOTE — Telephone Encounter (Signed)
Attempted to reach patient regarding lab results, but mail box has not been set up.  Will continue efforts.

## 2017-12-22 MED ORDER — COLCHICINE 0.6 MG PO TABS: 0.6000 mg | ORAL_TABLET | Freq: Two times a day (BID) | ORAL | 1 refills | Status: AC

## 2017-12-22 MED ORDER — IBUPROFEN 800 MG PO TABS: 800.0000 mg | ORAL_TABLET | Freq: Three times a day (TID) | ORAL | 1 refills | Status: AC

## 2017-12-22 NOTE — Telephone Encounter (Signed)
Patient notified of elevated CRP per Dr Dulce SellarMunley.   Patient instructed to restart colchine 0.6mg  one tablet twice daily and ibuprofen 800mg  one tablet three times daily.  Rx sent to pharmacy.  Patient will recheck CBC and CRP in one month. Patient agrees to plan.

## 2018-01-21 LAB — C-REACTIVE PROTEIN: CRP: <1 mg/L (ref 0–10)

## 2018-02-12 ENCOUNTER — Ambulatory Visit (INDEPENDENT_AMBULATORY_CARE_PROVIDER_SITE_OTHER): Payer: BLUE CROSS/BLUE SHIELD | Admitting: Cardiology

## 2018-02-12 ENCOUNTER — Encounter: Payer: Self-pay | Admitting: Cardiology

## 2018-02-12 VITALS — BP 120/78 | HR 66 | Ht 70.0 in | Wt 201.1 lb

## 2018-02-12 DIAGNOSIS — I300 Acute nonspecific idiopathic pericarditis: Secondary | ICD-10-CM | POA: Diagnosis not present

## 2018-02-12 DIAGNOSIS — I493 Ventricular premature depolarization: Secondary | ICD-10-CM

## 2018-02-12 NOTE — Progress Notes (Signed)
Cardiology Office Note:    Date:  02/12/2018   ID:  Jeff Horton, DOB Dec 01, 1995, MRN [id number]  PCP:  Maura Crandall, MD  Cardiologist:  Norman Herrlich, MD    Referring MD: Maura Crandall, MD    ASSESSMENT:    1. Recurrent idiopathic pericarditis   2. PVC (premature ventricular contraction)    PLAN:    In order of problems listed above:  1. He continues to be symptomatic he was having pleuropericardial pain yesterday his physical exam shows no rub will check an EKG today and recheck his inflammatory markers if he is on objective evidence of persistent pericarditis I will place him on steroids along with his colchicine and nonsteroidal anti-inflammatory drug 2. Improved no longer takes beta-blocker   Next appointment: PRN he will see Dr Farrel Conners in [place]   Medication Adjustments/Labs and Tests Ordered: Current medicines are reviewed at length with the patient today.  Concerns regarding medicines are outlined above.  No orders of the defined types were placed in this encounter.  No orders of the defined types were placed in this encounter.   No chief complaint on file.   History of Present Illness:    Jeff Horton is a 22 y.o. male with a hx of recurrent pericarditis last seen in July 2019 Compliance with diet, lifestyle and medications: Yes  Yesterday he was having palpitation but also having pleuropericardial chest pain is resolved today is the first episode since he went back on colchicine.  In view of his recurrent pericarditis and ongoing symptoms a look for objective evidence including C-reactive protein and sedimentation rate check a white count on colchicine check in office EKG and make a decision whether not we placed him on corticosteroids along with his ibuprofen and colchicine.  He is already had a cardiac MRI performed if he failed treatment with these 3 modalities I would recheck it looking for evidence of pericardial inflammation in the region case reports  of biologic agents similar to what she is been rheumatoid arthritis to control disease with failure of recurrent pericarditis.  His PVCs and palpitation is markedly improved no longer takes beta-blocker Past Medical History:  Diagnosis Date  . Allergic rhinitis 01/15/2013  . 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 Chest pain   . Closed displaced transverse fracture of shaft of right radius 06/28/2014  . HTN (hypertension) 01/05/2014   Overview:  BP mildly elevated.  NO LVH or coarctation on echo Monitoring BP and cardiology follow up in 02/2014  . Hypertension   . Pericarditis   . Right wrist pain 06/28/2014    Past Surgical History:  Procedure Laterality Date  . FINGER SURGERY      Current Medications: Current Meds  Medication Sig  . colchicine 0.6 MG tablet Take 1 tablet (0.6 mg total) by mouth 2 (two) times daily. (Patient taking differently: Take 0.6 mg by mouth daily. )  . ibuprofen (ADVIL,MOTRIN) 800 MG tablet Take 1 tablet (800 mg total) by mouth 3 (three) times daily.     Allergies:   Patient has no known allergies.   Social History   Socioeconomic History  . Marital status: Single    Spouse name: Not on file  . Number of children: Not on file  . Years of education: Not on file  . Highest education level: Not on file  Occupational History  . Not on file  Social Needs  . Financial resource strain: Not on file  . Food insecurity:    Worry:  Not on file    Inability: Not on file  . Transportation needs:    Medical: Not on file    Non-medical: Not on file  Tobacco Use  . Smoking status: Never Smoker  . Smokeless tobacco: Never Used  Substance and Sexual Activity  . Alcohol use: Yes  . Drug use: No  . Sexual activity: Not on file  Lifestyle  . Physical activity:    Days per week: Not on file    Minutes per session: Not on file  . Stress: Not on file  Relationships  . Social connections:    Talks on phone: Not on file    Gets together: Not on file    Attends  religious service: Not on file    Active member of club or organization: Not on file    Attends meetings of clubs or organizations: Not on file    Relationship status: Not on file  Other Topics Concern  . Not on file  Social History Narrative  . Not on file     Family History: The patient's family history includes Asthma in his mother; Coronary artery disease in his father; Diabetes in his maternal grandfather and maternal grandmother; Hypertension in his father, maternal grandfather, maternal grandmother, paternal grandfather, and paternal grandmother. ROS:   Please see the history of present illness.    All other systems reviewed and are negative.  EKGs/Labs/Other Studies Reviewed:    The following studies were reviewed today:  EKG:  EKG ordered today.  The ekg ordered today demonstrates sinus rhythm stable pattern of early repolarization I reviewed his Marland KitcMarland Kitche79nheWindeMarland KitcheMetro Specialty Surgery Center LLCn48w1dMouldingrMarMarland Kitche50nl5WindeMarland KitcheEndosurg Outpatient Center LLCn39w1dMouldingtCHel83Marland KitCMarland Kitche50nHeWindeMarland KitcheVa Medical Center - Northportn81w1dMouldingMarland Kitche37n3MWindeMarlandMarland Kitche80n KWindeMarland KitcMarlMarland Kitche46nanWindeMarland KitcheCenter For BehaviorMarland Kitche72nalWindMarland KitMarland Kitche79nchWindeMarland KitcheNyMarland KMarland Kitche11nitWindeMarland KitcheMclean Southeastn29w1dMouldingdMarland Kitche70neMarland Kitche35nMMarland Kitche3nWWWMarland Kitche95ninWindeMarland KitcheNortheastern Health Systemn32w1dMouldingtcheC76Marland KitCHeloise Purp1.3Marland KitchenGames deveDon [id number] Kitche60n80WindeMarland KitcheFort Washington Hospitaln79w1dMoulding8mot649Marland KitchCHeloise Purp1.3Marland KitchenGames deveDon [phone] Incerategy Officer KitchCHeloise Purp1.3MarlNorth Aspers Screening labs for rheumatologic disease complement levels double-stranded DNA and ANA were all normal Recent Labs: 09/26/2017: ALT 26; BUN 20; Creatinine, Ser 0.92; Hemoglobin 14.3; Platelets 206; Potassium 4.8; Sodium 141  Recent Lipid Panel No results found for: CHOL, TRIG, HDL, CHOLHDL, VLDL, LDLCALC, LDLDIRECT  Physical Exam:    VS:  BP 120/78 (BP Location: Right Arm, Patient Position: Sitting, Cuff Size: Large)   Pulse 66   Ht 5\' 10"  (1.778 m)   Wt 201 lb 1.9 oz (91.2 kg)   SpO2 98%   BMI 28.86 kg/m     Wt Readings from Last 3 Encounters:  02/12/18 201 lb 1.9 oz (91.2 kg) (92 %, Z= 1.42)*  11/12/17 202 lb 6.4 oz (91.8 kg) (93 %, Z= 1.48)*  09/26/17 201 lb (91.2 kg) (93 %, Z= 1.46)*   *  Growth percentiles are based on CDC (Boys, 2-20 Years) data.     GEN:  Well nourished, well developed in no acute distress HEENT: Normal NECK: No JVD; No carotid bruits LYMPHATICS: No lymphadenopathy CARDIAC: no rub RRR, no murmurs, rubs, gallops RESPIRATORY:  Clear to auscultation without rales, wheezing or rhonchi  ABDOMEN: Soft, non-tender, non-distended MUSCULOSKELETAL:  No edema; No deformity  SKIN: Warm and dry NEUROLOGIC:  Alert and oriented x 3 PSYCHIATRIC:  Normal affect    Signed, Samanvi Cuccia, MD  02/12/2018 10:43 AM    [facility]

## 2018-02-12 NOTE — Patient Instructions (Addendum)
Medication Instructions:  Your physician has recommended you make the following change in your medication:   You can stop your acebutolol.  If you need a refill on your cardiac medications before your next appointment, please call your pharmacy.   Lab work: Your physician recommends that you return for lab work today: CRP, sedimentation rate, CBC.   If you have labs (blood work) drawn today and your tests are completely normal, you will receive your results only by: Marland Kitchen MyChart Message (if you have MyChart) OR . A paper copy in the mail If you have any lab test that is abnormal or we need to change your treatment, we will call you to review the results.  Testing/Procedures: You had an EKG today.   Follow-Up: At [facility], you and your health needs are our priority.  As part of our continuing mission to provide you with exceptional heart care, we have created designated Provider Care Teams.  These Care Teams include your primary Cardiologist (physician) and Advanced Practice Providers (APPs -  Physician Assistants and Nurse Practitioners) who all work together to provide you with the care you need, when you need it. . You will need a follow up appointment with Dr. Dulce Sellar as needed if symptoms worsen or fail to improve.      KardiaMobile [email or web address]        FDA-cleared, clinical grade mobile EKG monitor: Lourena Simmonds is the most clinically-validated mobile EKG used by the world's leading cardiac care medical professionals With Basic service, know instantly if your heart rhythm is normal or if atrial fibrillation is detected, and email the last single EKG recording to yourself or your doctor Premium service, available for purchase through the Kardia app for $9.99 per month or $99 per year, includes unlimited history and storage of your EKG recordings, a monthly EKG summary report to share with your doctor, along with the ability to track your blood  pressure, activity and weight Includes one KardiaMobile phone clip FREE SHIPPING: Standard delivery 1-3 business days. Orders placed by 11:00am PST will ship that afternoon. Otherwise, will ship next business day. All orders ship via [employer] from [place], [place]       Introducing [place], the new wearable EKG by [employer]. Venetia Maxon replaces your original Apple Watch band. The first of its kind, FDA-cleared KardiaBand provides accurate and instant analysis for detecting atrial fibrillation (AF) and normal sinus rhythm in an EKG. Simply place your thumb on the integrated KardiaBand sensor to take a medical-grade EKG in just 30 seconds. Results appear instantly on your Apple Watch. Venetia Maxon is available today for just $199. KardiaBand features are designed exclusively for use with [employer] - $99 year. [employer] for [employer] includes [employer]'s revolutionary SmartRhythm monitoring feature. SmartRhythm monitoring uses an intelligent neural network that runs directly on the [employer], constantly acquiring data from the watch's heart rate sensor and its accelerometer. SmartRhythm compares your heart rate to what it expects from your minute-by-minute level of activity. When the network sees a pattern of heart rate and activity that it does not expect, it notifies you to take an EKG. With [employer], peace of mind is just an EKG away.  .   You can stop your acebutolol

## 2018-02-13 LAB — CBC
HEMATOCRIT: 43.7 % (ref 37.5–51.0)
Hemoglobin: 14.4 g/dL (ref 13.0–17.7)
MCH: 28.5 pg (ref 26.6–33.0)
MCHC: 33 g/dL (ref 31.5–35.7)
MCV: 86 fL (ref 79–97)
PLATELETS: 184 10*3/uL (ref 150–450)
RBC: 5.06 x10E6/uL (ref 4.14–5.80)
RDW: 13 % (ref 12.3–15.4)
WBC: 5.4 10*3/uL (ref 3.4–10.8)

## 2018-02-13 LAB — C-REACTIVE PROTEIN: CRP: 11 mg/L — ABNORMAL HIGH (ref 0–10)

## 2018-02-13 LAB — SEDIMENTATION RATE: SED RATE: 3 mm/h (ref 0–15)

## 2018-10-01 IMAGING — MR MR CARD MORPHOLOGY WO/W CM
13 of 15 series · 38 of 40 positions shown · IV contrast (multihance)
Comparison: none

CLINICAL DATA: Abnormal right heart on echo. History of
pericarditis.

EXAM:
CARDIAC MRI
TECHNIQUE: The patient was scanned on a 1.5 Tesla GE magnet. A dedicated
cardiac coil was used. Functional imaging was done using Fiesta
sequences. [DATE], and 4 chamber views were done to assess for RWMA's.
Modified [place] rule using a short axis stack was used to
calculate an ejection fraction on a dedicated work station using
Circle software. The patient received 30 cc of Multihance. After 10
minutes inversion recovery sequences were used to assess for
infiltration and scar tissue.
CONTRAST:  30 cc Multihance contrast.

[Series 4: bSSFP · sagittal · 8.0mm · 1.33mm/px · 1 of 19 slices shown (1 of 4)]
[im 1/19]
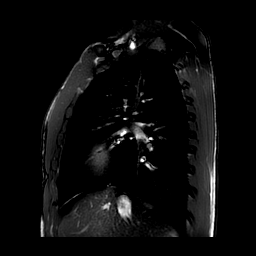

[Series 5: bSSFP · oblique · 8.0mm · 1.37mm/px · 6 of 160 slices shown (2 of 4)]
[im 1/160]
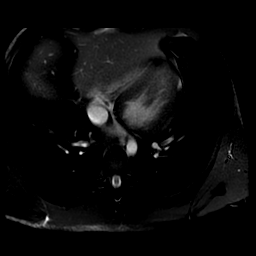
[im 32/160]
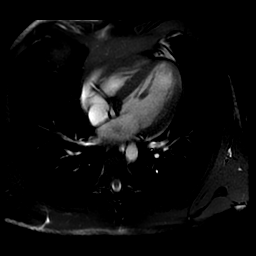
[im 64/160]
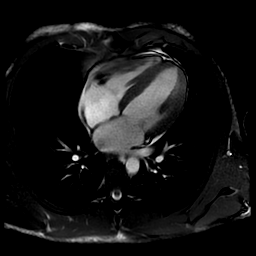
[im 96/160]
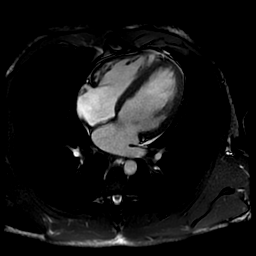
[im 128/160]
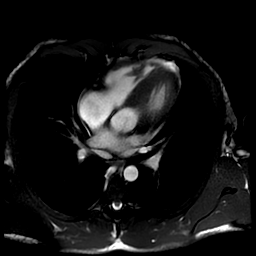
[im 160/160]
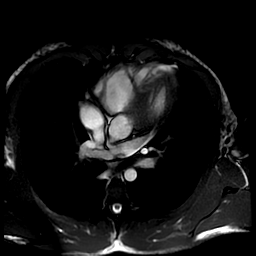

[Series 6: T1 · axial · 6.0mm · 0.98mm/px · 1 of 21 slices shown (1 of 3)]
[im 1/21]
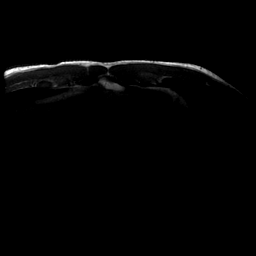

[Series 7: T1 · axial · 6.0mm · 1.05mm/px · 1 of 21 slices shown (2 of 3)]
[im 1/21]
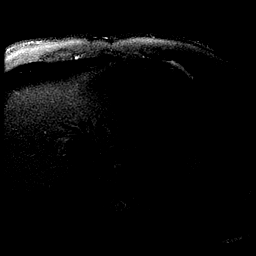

[Series 9: T1 · sagittal · 6.0mm · 0.90mm/px · 1 of 18 slices shown (3 of 3)]
[im 1/18]
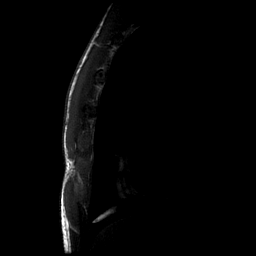

[Series 10: bSSFP · oblique · 8.0mm · 1.37mm/px · 19 of 476 slices shown (3 of 4)]
[im 1/476]
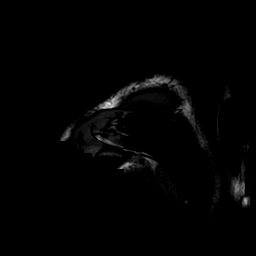
[im 27/476]
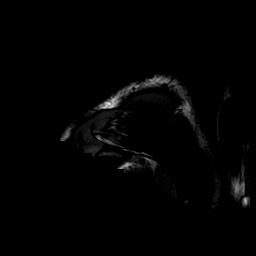
[im 53/476]
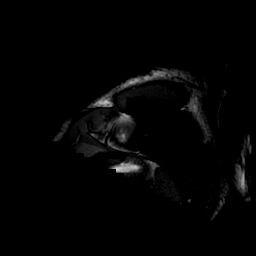
[im 80/476]
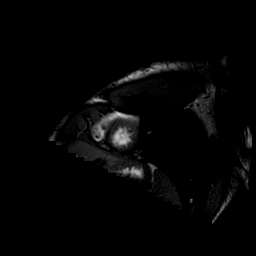
[im 106/476]
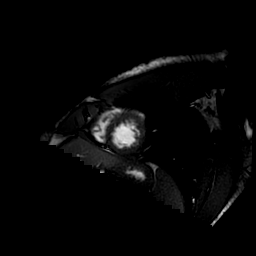
[im 132/476]
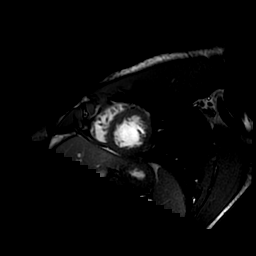
[im 159/476]
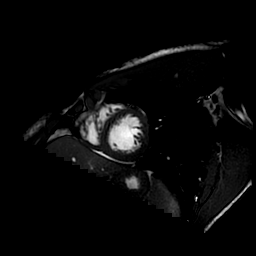
[im 185/476]
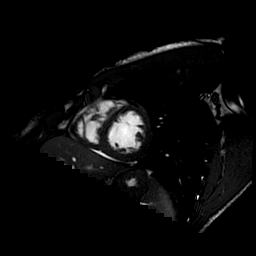
[im 212/476]
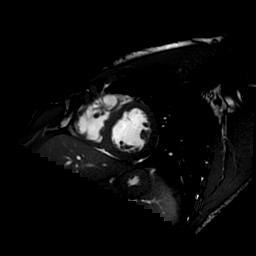
[im 238/476]
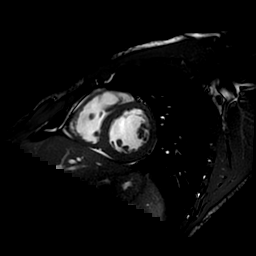
[im 264/476]
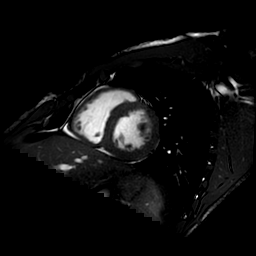
[im 291/476]
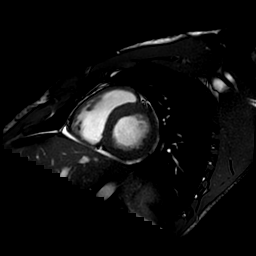
[im 317/476]
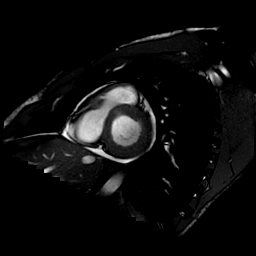
[im 344/476]
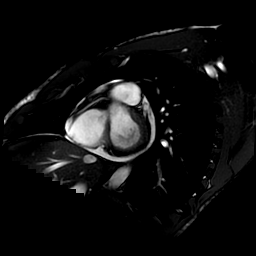
[im 370/476]
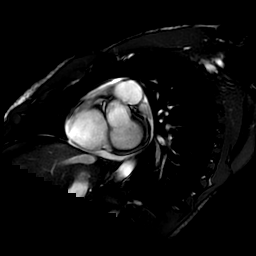
[im 396/476]
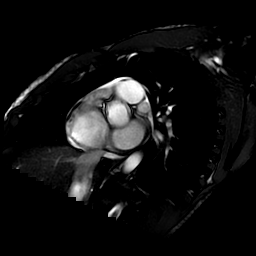
[im 423/476]
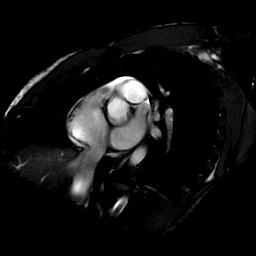
[im 449/476]
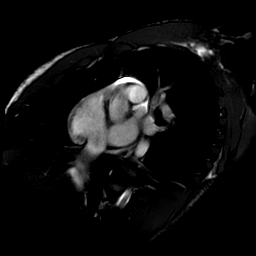
[im 476/476]
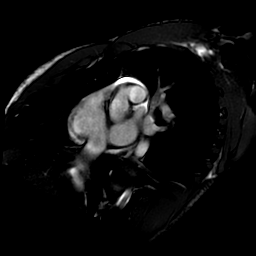

[Series 12: qpqs (id) · axial · 8.0mm · 1.48mm/px · 1 of 60 slices shown]
[im 1/60]
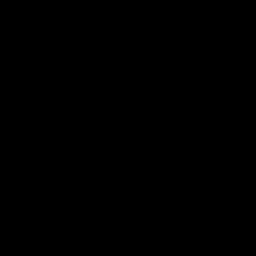

[Series 13: bSSFP · axial · 8.0mm · 1.45mm/px · z∈[-214,+175]mm · 2 of 60 slices shown (4 of 4)]
[im 1/60]
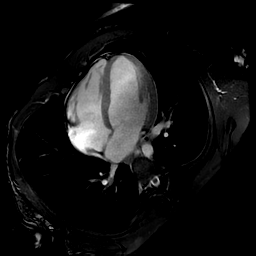
[im 60/60]
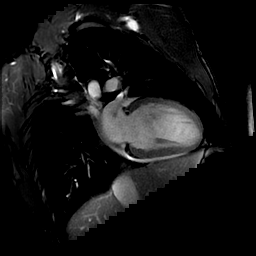

[Series 15: tags grid sa · oblique · 8.0mm · 1.37mm/px · 2 of 60 slices shown]
[im 1/60]
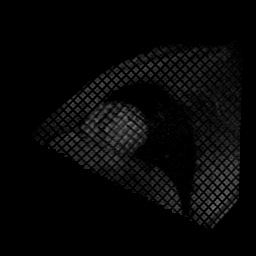
[im 60/60]
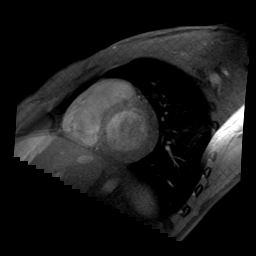

[Series 20: delayed ir prep · oblique · 8.0mm · 1.45mm/px · 1 of 6 slices shown (1 of 3)]
[im 1/6]
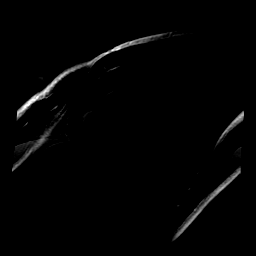

[Series 21: delayed ir prep · oblique · 8.0mm · 1.45mm/px · 1 of 9 slices shown (2 of 3)]
[im 1/9]
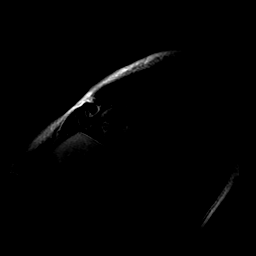

[Series 22: delayed ir prep · oblique · 8.0mm · 1.45mm/px · 1 of 7 slices shown (3 of 3)]
[im 1/7]
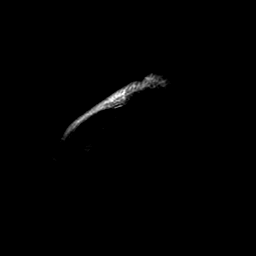

[Series 23: rad mde · axial · 8.0mm · 1.52mm/px · 1 of 3 slices shown]
[im 1/3]
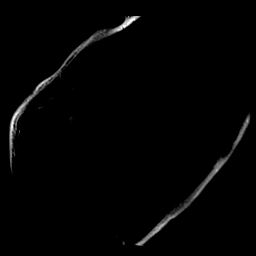

[38 of 40 positions shown; findings below may reference images not displayed]

FINDINGS: Limited images of the lung fields showed no significant
abnormalities.

There is a trivial pericardial effusion.

Mildly dilated left ventricle with normal wall thickness. Normal
wall motion, LV EF 61%. Mildly dilated right ventricle. RV EF 48%
with no regional wall motion abnormalities. Normal left atrial size.
Borderline dilated right atrium. Trileaflet aortic valve without
significant regurgitation or stenosis. No significant mitral
regurgitation.

On delayed enhancement imaging, there as no myocardial late
gadolinium enhancement (LGE).

Measurements:

LVEDV 237 mL

LVSV 144 mL

LVEF 61%

RVEDV 258 mL

RVSV 123 mL

RVEF 48%
IMPRESSION: 1.  Trivial pericardial effusion.

2.  Mildly dilated LV with EF 61%, normal wall motion.

3.  Mildly dilated RV with EF 48% (EF within normal limits for RV).

4. No myocardial LGE, so no evidence for prior MI, infiltrative
disease, or myocarditis.

Given young age of patient, possible athlete's heart.

Jadwisia Wacek
